# Patient Record
Sex: Male | Born: 1995 | Race: White | Hispanic: Yes | Marital: Single | Smoking: Current every day smoker
Health system: Southern US, Community
[De-identification: ages and names within clinical notes are randomized; demographics above are authoritative.]

## PROBLEM LIST (undated history)

## (undated) DIAGNOSIS — E669 Obesity, unspecified: Secondary | ICD-10-CM

## (undated) DIAGNOSIS — E039 Hypothyroidism, unspecified: Secondary | ICD-10-CM

## (undated) DIAGNOSIS — R7309 Other abnormal glucose: Secondary | ICD-10-CM

## (undated) DIAGNOSIS — L739 Follicular disorder, unspecified: Secondary | ICD-10-CM

## (undated) HISTORY — DX: Obesity, unspecified: E66.9

## (undated) HISTORY — DX: Hypothyroidism, unspecified: E03.9

## (undated) HISTORY — DX: Follicular disorder, unspecified: L73.9

## (undated) HISTORY — DX: Other abnormal glucose: R73.09

---
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
## 2013-02-07 ENCOUNTER — Encounter: Payer: Self-pay | Admitting: Family Medicine

## 2013-02-07 ENCOUNTER — Ambulatory Visit (INDEPENDENT_AMBULATORY_CARE_PROVIDER_SITE_OTHER): Payer: Medicaid Other | Admitting: Family Medicine

## 2013-02-07 VITALS — BP 144/82 | HR 96 | Temp 97.5°F | Resp 20 | Ht 69.0 in | Wt 273.0 lb

## 2013-02-07 DIAGNOSIS — J029 Acute pharyngitis, unspecified: Secondary | ICD-10-CM

## 2013-02-07 LAB — POCT RAPID STREP A (OFFICE): RAPID STREP A SCREEN: NEGATIVE

## 2013-02-07 NOTE — Patient Instructions (Signed)
Sore Throat A sore throat is pain, burning, irritation, or scratchiness of the throat. There is often pain or tenderness when swallowing or talking. A sore throat may be accompanied by other symptoms, such as coughing, sneezing, fever, and swollen neck glands. A sore throat is often the first sign of another sickness, such as a cold, flu, strep throat, or mononucleosis (commonly known as mono). Most sore throats go away without medical treatment. CAUSES  The most common causes of a sore throat include:  A viral infection, such as a cold, flu, or mono.  A bacterial infection, such as strep throat, tonsillitis, or whooping cough.  Seasonal allergies.  Dryness in the air.  Irritants, such as smoke or pollution.  Gastroesophageal reflux disease (GERD). HOME CARE INSTRUCTIONS   Only take over-the-counter medicines as directed by your caregiver.  Drink enough fluids to keep your urine clear or pale yellow.  Rest as needed.  Try using throat sprays, lozenges, or sucking on hard candy to ease any pain (if older than 4 years or as directed).  Sip warm liquids, such as broth, herbal tea, or warm water with honey to relieve pain temporarily. You may also eat or drink cold or frozen liquids such as frozen ice pops.  Gargle with salt water (mix 1 tsp salt with 8 oz of water).  Do not smoke and avoid secondhand smoke.  Put a cool-mist humidifier in your bedroom at night to moisten the air. You can also turn on a hot shower and sit in the bathroom with the door closed for 5 10 minutes. SEEK IMMEDIATE MEDICAL CARE IF:  You have difficulty breathing.  You are unable to swallow fluids, soft foods, or your saliva.  You have increased swelling in the throat.  Your sore throat does not get better in 7 days.  You have nausea and vomiting.  You have a fever or persistent symptoms for more than 2 3 days.  You have a fever and your symptoms suddenly get worse. MAKE SURE YOU:   Understand  these instructions.  Will watch your condition.  Will get help right away if you are not doing well or get worse. Document Released: 02/17/2004 Document Revised: 12/27/2011 Document Reviewed: 09/17/2011 ExitCare Patient Information 2014 [employer].  

## 2013-02-08 LAB — EPSTEIN-BARR VIRUS VCA ANTIBODY PANEL
EBV EA IgG: <5 U/mL (ref <9.0)
EBV NA IgG: 537 U/mL — ABNORMAL HIGH (ref <18.0)
EBV VCA IGG: 227 U/mL — AB (ref <18.0)

## 2013-02-10 ENCOUNTER — Telehealth: Payer: Self-pay

## 2013-02-10 LAB — CULTURE, GROUP A STREP

## 2013-02-10 NOTE — Progress Notes (Signed)
   Subjective:    Patient ID: Billy [name], male    DOB: October 02, 1995, 18 y.o.   MRN: [id number]  HPI Patient is here today with a sore throat that has been worsening over the past 3-5 days. He did have a cold last week but the symptoms aside from sore throat have resolved. He thinks he may have had a bit of a fever although today's afebrile and hasn't taken any Tylenol. He hasn't been near any sick contacts. He denies any other symptoms. He is eating well, drinking well   Review of Systems A 12 point review of systems is negative except as per hpi.       Objective:   Physical Exam Nursing note and vitals reviewed. Constitutional: He is oriented to person, place, and time. He  appears well-developed and well-nourished.  HENT:  Right Ear: External ear normal.  Left Ear: External ear normal.  Nose: Nose normal.  Mouth/Throat: Oropharynx is clear and moist. No oropharyngeal exudate.  there is some tonsillar edema. Airway is not compromised. Eyes: Conjunctivae are normal. Pupils are equal, round, and reactive to light.  Neck: Normal range of motion. Neck supple. No thyromegaly present.  Cardiovascular: Normal rate, regular rhythm and normal heart sounds.   Pulmonary/Chest: Effort normal and breath sounds normal.  Abdominal: Soft. Bowel sounds are normal.  no distension. There is no tenderness. There is no rebound.  Lymphadenopathy:    He has mildcervical adenopathy.  Neurological: He is alert and oriented to person, place, and time. He has normal reflexes.  Skin: Skin is warm and dry.He has no concerning moles or skin lesions Psychiatric: He has a normal mood and affect. His behavior is normal.        Assessment & Plan:  Billy [name] was seen today for sore throat, generalized body aches and headache.  Diagnoses and associated orders for this visit:  Sore throat - POCT rapid strep A - Throat culture ([facility]) - Epstein-Barr virus VCA antibody panel

## 2013-02-10 NOTE — Telephone Encounter (Signed)
Pt. Notified and appreciative.

## 2013-02-10 NOTE — Telephone Encounter (Signed)
Message copied by Vevelyn FrancoisBULLIN, Raymondo Garcialopez O on Mon Feb 10, 2013  1:46 PM ------      Message from: Acey LavWOOD, ALLISON L      Created: Mon Feb 10, 2013 10:11 AM       Please let pt know his bloodwork shows no mono. Thanks AW ------

## 2013-02-18 ENCOUNTER — Other Ambulatory Visit: Payer: Self-pay | Admitting: Pediatrics

## 2013-06-09 ENCOUNTER — Other Ambulatory Visit: Payer: Self-pay | Admitting: Pediatrics

## 2013-10-01 ENCOUNTER — Encounter: Payer: Self-pay | Admitting: Pediatrics

## 2013-10-01 ENCOUNTER — Ambulatory Visit (INDEPENDENT_AMBULATORY_CARE_PROVIDER_SITE_OTHER): Payer: Medicaid Other | Admitting: Pediatrics

## 2013-10-01 VITALS — BP 160/90 | Ht 60.1 in | Wt 258.0 lb

## 2013-10-01 DIAGNOSIS — Z00129 Encounter for routine child health examination without abnormal findings: Secondary | ICD-10-CM

## 2013-10-01 DIAGNOSIS — E039 Hypothyroidism, unspecified: Secondary | ICD-10-CM

## 2013-10-01 DIAGNOSIS — Z23 Encounter for immunization: Secondary | ICD-10-CM

## 2013-10-01 MED ORDER — THYROID 120 MG PO TABS: ORAL_TABLET | ORAL | Status: AC

## 2013-10-01 NOTE — Patient Instructions (Signed)
Hypothyroidism The thyroid is a large gland located in the lower front of your neck. The thyroid gland helps control metabolism. Metabolism is how your body handles food. It controls metabolism with the hormone thyroxine. When this gland is underactive (hypothyroid), it produces too little hormone.  CAUSES These include:   Absence or destruction of thyroid tissue.  Goiter due to iodine deficiency.  Goiter due to medications.  Congenital defects (since birth).  Problems with the pituitary. This causes a lack of TSH (thyroid stimulating hormone). This hormone tells the thyroid to turn out more hormone. SYMPTOMS  Lethargy (feeling as though you have no energy)  Cold intolerance  Weight gain (in spite of normal food intake)  Dry skin  Coarse hair  Menstrual irregularity (if severe, may lead to infertility)  Slowing of thought processes Cardiac problems are also caused by insufficient amounts of thyroid hormone. Hypothyroidism in the newborn is cretinism, and is an extreme form. It is important that this form be treated adequately and immediately or it will lead rapidly to retarded physical and mental development. DIAGNOSIS  To prove hypothyroidism, your caregiver may do blood tests and ultrasound tests. Sometimes the signs are hidden. It may be necessary for your caregiver to watch this illness with blood tests either before or after diagnosis and treatment. TREATMENT  Low levels of thyroid hormone are increased by using synthetic thyroid hormone. This is a safe, effective treatment. It usually takes about four weeks to gain the full effects of the medication. After you have the full effect of the medication, it will generally take another four weeks for problems to leave. Your caregiver may start you on low doses. If you have had heart problems the dose may be gradually increased. It is generally not an emergency to get rapidly to normal. HOME CARE INSTRUCTIONS   Take your  medications as your caregiver suggests. Let your caregiver know of any medications you are taking or start taking. Your caregiver will help you with dosage schedules.  As your condition improves, your dosage needs may increase. It will be necessary to have continuing blood tests as suggested by your caregiver.  Report all suspected medication side effects to your caregiver. SEEK MEDICAL CARE IF: Seek medical care if you develop:  Sweating.  Tremulousness (tremors).  Anxiety.  Rapid weight loss.  Heat intolerance.  Emotional swings.  Diarrhea.  Weakness. SEEK IMMEDIATE MEDICAL CARE IF:  You develop chest pain, an irregular heart beat (palpitations), or a rapid heart beat. MAKE SURE YOU:   Understand these instructions.  Will watch your condition.  Will get help right away if you are not doing well or get worse. Document Released: 01/09/2005 Document Revised: 04/03/2011 Document Reviewed: 08/30/2007 [employer] Patient Information 2015 [employer]. This information is not intended to replace advice given to you by your health care provider. Make sure you discuss any questions you have with your health care provider.  

## 2013-10-01 NOTE — Progress Notes (Signed)
Routine Well-Adolescent Visit    PCP: Martyn Ehrich, MD   History was provided by the patient.  Billy [name] is a 18 y.o. male who is here for a checkup   Current concerns: History of hypothyroidism on medication.    Home and Environment:  Lives with: lives at home with Mother Parental relations: Improved this year he visit his father in [place]  Nutrition/Eating Behaviors: Regular Sports/Exercise just a little  Education and Employment:  School Status: Once the back-to-schoosoon. and is motivated          Secondhand smoke exposure? no Drugs/EtOH: None          Physical Exam:  BP 160/90  Ht 5' 0.1" (1.527 m)  Wt 258 lb (117.028 kg)  BMI 50.19 kg/m2 Blood pressure percentiles are 100% systolic and 87% diastolic based on 2000 NHANES data.   General Appearance:   alert, oriented, no acute distress  HENT: Normocephalic, no obvious abnormality, PERRL, EOM's intact, conjunctiva clear  Mouth:   Normal appearing teeth, no obvious discoloration, dental caries, or dental caps  Neck:   Supple; thyroid: no enlargement, symmetric, no tenderness/mass/nodules  Lungs:   Clear to auscultation bilaterally, normal work of breathing  Heart:   Regular rate and rhythm, S1 and S2 normal, no murmurs;   Abdomen:   Soft, non-tender, no mass, or organomegaly     Musculoskeletal:   Tone and strength strong and symmetrical, all extremities               Lymphatic:   No cervical adenopathy  Skin/Hair/Nails:   Skin warm, dry and intact, no rashes, no bruises or petechiae  Neurologic:   Strength, gait, and coordination normal and age-appropriate    Assessment/Plan: Will check up today, hypothyroidism   Immunizations today: per orders. History of previous adverse reactions to immunizations? no Get free T4 and TSH today, refilled his thyroid medication - Follow-up visit in 1 year for next visit, or sooner as needed.  HPV #1 hepatitis A #1 Aadith Raudenbush, Idalia Needle, MD

## 2013-10-23 ENCOUNTER — Ambulatory Visit (INDEPENDENT_AMBULATORY_CARE_PROVIDER_SITE_OTHER): Payer: Medicaid Other | Admitting: Pediatrics

## 2013-10-23 ENCOUNTER — Encounter: Payer: Self-pay | Admitting: Pediatrics

## 2013-10-23 VITALS — BP 118/78 | Temp 98.4°F | Ht 70.0 in | Wt 263.2 lb

## 2013-10-23 DIAGNOSIS — H109 Unspecified conjunctivitis: Secondary | ICD-10-CM

## 2013-10-23 NOTE — Progress Notes (Signed)
Here for assessment of red eye for 2-3 days. No fever, no nasal congestion or cough. No hx of trauma. Eye has a slight gritty feeling that is mildly uncomfortable but eye is not painful. Some sticky secretions from eye but no purulent secretions.  Occasional HA. No skin rashes. No known infectious contacts. Hx of seasonal allergies but no other allergy Sx at this time.  PMHx: Hypothyroidism, increased BMI. Takes thyroid replacment. Just had PE last month. Has med refills for the next 4 months. Allergic to Meclizine  Transferring to adult health care provider -- plans to go to free clinic b/o no insurance.   PE Alert, normal affect, good eye contact VS normal BP 118/78 HEENT normal except for injected left eye -- PERRL, no discharge or excessive tearing Vision 20/20 bilaterally No skin rashes, no lymphadenopathy  Imp: Conjunctivitis, left eye   P: Sx relief Advised recheck if not clearing in 7-10 d, earlier if purulent eye discharge, visual disturbance, increasing eye pain or development of blisters on skin around eye Will need to complete immunization schedule

## 2013-10-23 NOTE — Patient Instructions (Signed)
Conjunctivitis Conjunctivitis is commonly called "pink eye." Conjunctivitis can be caused by bacterial or viral infection, allergies, or injuries. There is usually redness of the lining of the eye, itching, discomfort, and sometimes discharge. There may be deposits of matter along the eyelids. A viral infection usually causes a watery discharge, while a bacterial infection causes a yellowish, thick discharge. Pink eye is very contagious and spreads by direct contact. You may be given antibiotic eyedrops as part of your treatment. Before using your eye medicine, remove all drainage from the eye by washing gently with warm water and cotton balls. Continue to use the medication until you have awakened 2 mornings in a row without discharge from the eye. Do not rub your eye. This increases the irritation and helps spread infection. Use separate towels from other household members. Wash your hands with soap and water before and after touching your eyes. Use cold compresses to reduce pain and sunglasses to relieve irritation from light. Do not wear contact lenses or wear eye makeup until the infection is gone. SEEK MEDICAL CARE IF:   Your symptoms are not better after 3 days of treatment.  You have increased pain or trouble seeing.  The outer eyelids become very red or swollen. Document Released: 02/17/2004 Document Revised: 04/03/2011 Document Reviewed: 01/09/2005 [facility] Patient Information 2015 [place], [place]. This information is not intended to replace advice given to you by your health care provider. Make sure you discuss any questions you have with your health care provider.  Recheck by MD if you develop any skin blisters, severe eye pain or visual disturbance, or thick, green eye goop -- otherwise expect this to resolve in 7 -10 days and treatment for comfort to relieve symptoms -- cool compresses, artificial tears.

## 2013-10-24 LAB — TSH: TSH: 0.37 u[IU]/mL (ref 0.350–4.500)

## 2013-10-24 LAB — T4, FREE: Free T4: 1.08 ng/dL (ref 0.80–1.80)

## 2013-11-14 ENCOUNTER — Encounter: Payer: Self-pay | Admitting: Pediatrics

## 2013-11-14 ENCOUNTER — Ambulatory Visit (INDEPENDENT_AMBULATORY_CARE_PROVIDER_SITE_OTHER): Payer: Medicaid Other | Admitting: Pediatrics

## 2013-11-14 DIAGNOSIS — IMO0002 Reserved for concepts with insufficient information to code with codable children: Secondary | ICD-10-CM

## 2013-11-14 DIAGNOSIS — R229 Localized swelling, mass and lump, unspecified: Secondary | ICD-10-CM

## 2013-11-14 NOTE — Progress Notes (Signed)
   Subjective:    Patient ID: Billy [name], male    DOB: 12-Jan-1996, 18 y.o.   MRN: [id number]  HPI 18 year old male with a mass on his mid back for over a year. Not painfu,  never was. Does not going away at all. History of acne and staph infections on his back in the past.    Review of Systems noncontributory     Objective:   Physical Exam Alert oriented no distress Skin: Mid back has a 4 cm nontender raise soft mass.        Assessment & Plan:  Mass, back. Not consistent with lipoma Plan refer to surgery to evaluate and treat if needed

## 2013-12-02 ENCOUNTER — Ambulatory Visit: Payer: Medicaid Other

## 2016-10-23 DEATH — deceased
